# Patient Record
Sex: Female | Born: 1950
Health system: Southern US, Community
[De-identification: ages and names within clinical notes are randomized; demographics above are authoritative.]

## PROBLEM LIST (undated history)

## (undated) DIAGNOSIS — M810 Age-related osteoporosis without current pathological fracture: Secondary | ICD-10-CM

## (undated) DIAGNOSIS — J329 Chronic sinusitis, unspecified: Secondary | ICD-10-CM

## (undated) DIAGNOSIS — C44310 Basal cell carcinoma of skin of unspecified parts of face: Secondary | ICD-10-CM

## (undated) DIAGNOSIS — I341 Nonrheumatic mitral (valve) prolapse: Secondary | ICD-10-CM

## (undated) DIAGNOSIS — F419 Anxiety disorder, unspecified: Secondary | ICD-10-CM

## (undated) DIAGNOSIS — E871 Hypo-osmolality and hyponatremia: Secondary | ICD-10-CM

## (undated) DIAGNOSIS — N951 Menopausal and female climacteric states: Secondary | ICD-10-CM

## (undated) DIAGNOSIS — Z87891 Personal history of nicotine dependence: Secondary | ICD-10-CM

## (undated) HISTORY — DX: Nonrheumatic mitral (valve) prolapse: I34.1

## (undated) HISTORY — DX: Menopausal and female climacteric states: N95.1

## (undated) HISTORY — PX: ANAL FISSURE REPAIR: SHX2312

## (undated) HISTORY — DX: Chronic sinusitis, unspecified: J32.9

## (undated) HISTORY — DX: Basal cell carcinoma of skin of unspecified parts of face: C44.310

## (undated) HISTORY — DX: Hypo-osmolality and hyponatremia: E87.1

## (undated) HISTORY — DX: Anxiety disorder, unspecified: F41.9

## (undated) HISTORY — DX: Age-related osteoporosis without current pathological fracture: M81.0

## (undated) HISTORY — DX: Personal history of nicotine dependence: Z87.891

---

## 1993-11-28 HISTORY — PX: NASAL SINUS SURGERY: SHX719

## 1998-09-25 ENCOUNTER — Emergency Department (HOSPITAL_COMMUNITY): Admission: EM | Admit: 1998-09-25 | Discharge: 1998-09-25 | Payer: Self-pay | Admitting: Emergency Medicine

## 1998-09-25 ENCOUNTER — Encounter: Payer: Self-pay | Admitting: Emergency Medicine

## 1999-07-27 ENCOUNTER — Other Ambulatory Visit: Admission: RE | Admit: 1999-07-27 | Discharge: 1999-07-27 | Payer: Self-pay | Admitting: Gynecology

## 2001-01-04 ENCOUNTER — Other Ambulatory Visit: Admission: RE | Admit: 2001-01-04 | Discharge: 2001-01-04 | Payer: Self-pay | Admitting: *Deleted

## 2002-07-31 ENCOUNTER — Other Ambulatory Visit: Admission: RE | Admit: 2002-07-31 | Discharge: 2002-07-31 | Payer: Self-pay | Admitting: Family Medicine

## 2004-08-11 ENCOUNTER — Other Ambulatory Visit: Admission: RE | Admit: 2004-08-11 | Discharge: 2004-08-11 | Payer: Self-pay | Admitting: Family Medicine

## 2006-06-07 ENCOUNTER — Ambulatory Visit: Payer: Self-pay | Admitting: Family Medicine

## 2006-06-07 ENCOUNTER — Other Ambulatory Visit: Admission: RE | Admit: 2006-06-07 | Discharge: 2006-06-07 | Payer: Self-pay | Admitting: Family Medicine

## 2007-05-02 ENCOUNTER — Ambulatory Visit: Payer: Self-pay | Admitting: Family Medicine

## 2007-12-21 ENCOUNTER — Ambulatory Visit: Payer: Self-pay | Admitting: Family Medicine

## 2008-08-06 ENCOUNTER — Other Ambulatory Visit: Admission: RE | Admit: 2008-08-06 | Discharge: 2008-08-06 | Payer: Self-pay | Admitting: Family Medicine

## 2008-08-06 ENCOUNTER — Ambulatory Visit: Payer: Self-pay | Admitting: Family Medicine

## 2008-08-06 LAB — HM PAP SMEAR: HM Pap smear: NEGATIVE

## 2009-08-05 ENCOUNTER — Ambulatory Visit: Payer: Self-pay | Admitting: Family Medicine

## 2009-09-14 ENCOUNTER — Ambulatory Visit: Payer: Self-pay | Admitting: Family Medicine

## 2009-09-29 LAB — HM MAMMOGRAPHY: HM Mammogram: NEGATIVE

## 2009-09-29 LAB — HM DEXA SCAN

## 2009-12-03 ENCOUNTER — Ambulatory Visit: Payer: Self-pay | Admitting: Family Medicine

## 2009-12-21 ENCOUNTER — Encounter: Admission: RE | Admit: 2009-12-21 | Discharge: 2009-12-21 | Payer: Self-pay | Admitting: Family Medicine

## 2010-05-24 ENCOUNTER — Ambulatory Visit: Payer: Self-pay | Admitting: Family Medicine

## 2011-04-13 ENCOUNTER — Other Ambulatory Visit: Payer: Self-pay | Admitting: Family Medicine

## 2011-04-13 MED ORDER — ALPRAZOLAM 0.5 MG PO TABS: 0.5000 mg | ORAL_TABLET | Freq: Three times a day (TID) | ORAL | Status: AC | PRN

## 2011-04-13 NOTE — Telephone Encounter (Addendum)
Pt was former pt of kim. Best friend past away last night. Pt very upset on phone needs refill on Xanax and atenolol. Will schedule an appt for cpe as soon as she knows arrangements. The medication was called in

## 2011-04-15 ENCOUNTER — Telehealth: Payer: Self-pay

## 2011-04-15 ENCOUNTER — Telehealth: Payer: Self-pay | Admitting: Family Medicine

## 2011-04-15 MED ORDER — ATENOLOL 25 MG PO TABS: 25.0000 mg | ORAL_TABLET | Freq: Every day | ORAL | Status: DC

## 2011-04-15 NOTE — Telephone Encounter (Signed)
Her medication was called in. Have her set up an appointment for medication check.

## 2011-04-15 NOTE — Telephone Encounter (Signed)
Called pt and let pt know she needed a med check per jcl she said she would make one when all the funeral stuff was done

## 2011-04-15 NOTE — Telephone Encounter (Signed)
8 atenolol called in. She needs a followup appointment.

## 2011-09-22 ENCOUNTER — Encounter: Payer: Self-pay | Admitting: Family Medicine

## 2017-04-05 ENCOUNTER — Ambulatory Visit (INDEPENDENT_AMBULATORY_CARE_PROVIDER_SITE_OTHER): Payer: Medicare Other | Admitting: Family Medicine

## 2017-04-05 ENCOUNTER — Encounter: Payer: Self-pay | Admitting: Family Medicine

## 2017-04-05 VITALS — BP 140/90 | HR 88 | Ht 62.0 in | Wt 130.6 lb

## 2017-04-05 DIAGNOSIS — M810 Age-related osteoporosis without current pathological fracture: Secondary | ICD-10-CM | POA: Insufficient documentation

## 2017-04-05 DIAGNOSIS — Z87891 Personal history of nicotine dependence: Secondary | ICD-10-CM | POA: Diagnosis not present

## 2017-04-05 DIAGNOSIS — Z7689 Persons encountering health services in other specified circumstances: Secondary | ICD-10-CM

## 2017-04-05 DIAGNOSIS — L989 Disorder of the skin and subcutaneous tissue, unspecified: Secondary | ICD-10-CM

## 2017-04-05 DIAGNOSIS — M8580 Other specified disorders of bone density and structure, unspecified site: Secondary | ICD-10-CM | POA: Diagnosis not present

## 2017-04-05 NOTE — Progress Notes (Signed)
Subjective:    Patient ID: Kim Johnson, female    DOB: Oct 11, 1951, 66 y.o.   MRN: [id number]  HPI Chief Complaint  Patient presents with  . new pt    new pt get established. wants tetanus shot. no other concerns   She is here to establish care. Was a patient in this office previously but has not been seen in approximately 6 years.   States she has Medicare now and would like to come back for a welcome to Medicare visit.  States she would like a tetanus shot. 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   States she has a skin issue on her right side of her nose that has been present for 2-3 years and seems to be getting larger. Would like to see a dermatologist.  History of IBS. Denies issues with this.  History of asthma in her chart and she denies having any issues with this.  MVP in her chart and states she had more testing and this was actually ruled out.  She denies any other concerns or complaints today.   Denies fever, chills, unexplained weight loss, night sweats, headache, chest pain, palpitations, abdominal pain, N/V/D. No urinary symptoms.   Last CPE: 6-7 years ago.   Other providers: none  Social history: Lives alone. Widowed, works as a Architectural technologist.   Quit smoking 28 years ago. Smoked 1 ppd for 20 years.  States her husband was killed in the late 1990s.   Health maintenance:  Mammogram: 2011  Colonoscopy: age 38 DEXA: diagnosed with osteopenia at age 83 and she took medication for this.  Last Gynecological Exam: 2009 LMP: 2002  History of MVP? Had a stress test in 2004 and states it was normal.   Reviewed allergies, medications, past medical, surgical, family, and social history.    Review of Systems Pertinent positives and negatives in the history of present illness.     Objective:   Physical Exam  Constitutional: She appears well-developed and well-nourished. No distress.  HENT:  Nose:    Mouth/Throat: Uvula  is midline, oropharynx is clear and moist and mucous membranes are normal.  Red raised area, shiny and with an ulceration on her the right side of her nose.   Neck: Normal range of motion. Neck supple. No thyromegaly present.  Cardiovascular: Normal rate, regular rhythm and normal heart sounds.   Pulmonary/Chest: Effort normal and breath sounds normal.  Lymphadenopathy:    She has no cervical adenopathy.  Skin: Skin is warm and dry. No rash noted. No pallor.   BP 140/90   Pulse 88   Ht 5\' 2"  (1.575 m)   Wt 130 lb 9.6 oz (59.2 kg)   BMI 23.89 kg/m        Assessment & Plan:  Skin abnormality  Encounter to establish care  Former smoker  Osteopenia, unspecified location  Recommend she call and schedule a dermatology appointment for the spot on her nose. This is suspicious for BCC. She also has a history of this.  Prescription given for her to take to her pharmacy for Tdap and Shingrix. Advised her to call her insurance company prior to find out her financial responsibility prior to getting the shots.  Former smoker but quit at least 20 years ago.  Will need a bone density to follow up on osteopenia.  She will return in the next month for a IPPE, fasting labs and will need to be  updated on health maintenance.

## 2017-04-05 NOTE — Patient Instructions (Signed)
Dermatology offices  [facility]: Phone #: [phone] Address: [address], [place], [place] [address]  [facility]: Phone: [phone]  Address: [address], [place], [place] [address]

## 2017-05-08 ENCOUNTER — Other Ambulatory Visit (HOSPITAL_COMMUNITY)
Admission: RE | Admit: 2017-05-08 | Discharge: 2017-05-08 | Disposition: A | Discharge status: Home / Self Care | Payer: Medicare Other | Source: Ambulatory Visit | Attending: Family Medicine | Admitting: Family Medicine

## 2017-05-08 ENCOUNTER — Other Ambulatory Visit: Payer: Self-pay | Admitting: Family Medicine

## 2017-05-08 ENCOUNTER — Ambulatory Visit (INDEPENDENT_AMBULATORY_CARE_PROVIDER_SITE_OTHER): Payer: Medicare Other | Admitting: Family Medicine

## 2017-05-08 ENCOUNTER — Encounter: Payer: Self-pay | Admitting: Family Medicine

## 2017-05-08 VITALS — BP 120/80 | HR 60 | Ht 63.0 in | Wt 129.2 lb

## 2017-05-08 DIAGNOSIS — R5383 Other fatigue: Secondary | ICD-10-CM | POA: Diagnosis not present

## 2017-05-08 DIAGNOSIS — Z87898 Personal history of other specified conditions: Secondary | ICD-10-CM

## 2017-05-08 DIAGNOSIS — Z0000 Encounter for general adult medical examination without abnormal findings: Secondary | ICD-10-CM | POA: Diagnosis not present

## 2017-05-08 DIAGNOSIS — M25571 Pain in right ankle and joints of right foot: Secondary | ICD-10-CM | POA: Diagnosis not present

## 2017-05-08 DIAGNOSIS — Z1159 Encounter for screening for other viral diseases: Secondary | ICD-10-CM

## 2017-05-08 DIAGNOSIS — E2839 Other primary ovarian failure: Secondary | ICD-10-CM

## 2017-05-08 DIAGNOSIS — M8580 Other specified disorders of bone density and structure, unspecified site: Secondary | ICD-10-CM | POA: Diagnosis not present

## 2017-05-08 DIAGNOSIS — Z1211 Encounter for screening for malignant neoplasm of colon: Secondary | ICD-10-CM | POA: Diagnosis not present

## 2017-05-08 DIAGNOSIS — Z23 Encounter for immunization: Secondary | ICD-10-CM

## 2017-05-08 DIAGNOSIS — Z124 Encounter for screening for malignant neoplasm of cervix: Secondary | ICD-10-CM | POA: Diagnosis not present

## 2017-05-08 DIAGNOSIS — F418 Other specified anxiety disorders: Secondary | ICD-10-CM

## 2017-05-08 DIAGNOSIS — Z1231 Encounter for screening mammogram for malignant neoplasm of breast: Secondary | ICD-10-CM | POA: Diagnosis not present

## 2017-05-08 DIAGNOSIS — G8929 Other chronic pain: Secondary | ICD-10-CM

## 2017-05-08 DIAGNOSIS — Z87891 Personal history of nicotine dependence: Secondary | ICD-10-CM

## 2017-05-08 DIAGNOSIS — Z1239 Encounter for other screening for malignant neoplasm of breast: Secondary | ICD-10-CM

## 2017-05-08 DIAGNOSIS — R42 Dizziness and giddiness: Secondary | ICD-10-CM | POA: Diagnosis not present

## 2017-05-08 DIAGNOSIS — Z136 Encounter for screening for cardiovascular disorders: Secondary | ICD-10-CM | POA: Diagnosis not present

## 2017-05-08 LAB — POCT URINALYSIS DIP (PROADVANTAGE DEVICE)
Bilirubin, UA: NEGATIVE
Blood, UA: NEGATIVE
Glucose, UA: NEGATIVE mg/dL
Ketones, POC UA: NEGATIVE mg/dL
LEUKOCYTES UA: NEGATIVE
Nitrite, UA: NEGATIVE
PROTEIN UA: NEGATIVE mg/dL
Specific Gravity, Urine: 1.01
UUROB: NEGATIVE
pH, UA: 6.5 (ref 5.0–8.0)

## 2017-05-08 LAB — COMPREHENSIVE METABOLIC PANEL
ALBUMIN: 4.4 g/dL (ref 3.6–5.1)
ALT: 43 U/L — ABNORMAL HIGH (ref 6–29)
AST: 59 U/L — AB (ref 10–35)
Alkaline Phosphatase: 63 U/L (ref 33–130)
BILIRUBIN TOTAL: 0.9 mg/dL (ref 0.2–1.2)
BUN: 6 mg/dL — AB (ref 7–25)
CO2: 18 mmol/L — AB (ref 20–31)
CREATININE: 0.64 mg/dL (ref 0.50–0.99)
Calcium: 9.3 mg/dL (ref 8.6–10.4)
Chloride: 99 mmol/L (ref 98–110)
Glucose, Bld: 108 mg/dL — ABNORMAL HIGH (ref 65–99)
Potassium: 4.1 mmol/L (ref 3.5–5.3)
SODIUM: 129 mmol/L — AB (ref 135–146)
Total Protein: 7.2 g/dL (ref 6.1–8.1)

## 2017-05-08 LAB — CBC WITH DIFFERENTIAL/PLATELET
Basophils Absolute: 0 cells/uL (ref 0–200)
Basophils Relative: 0 %
EOS PCT: 2 %
Eosinophils Absolute: 110 cells/uL (ref 15–500)
HCT: 43.1 % (ref 35.0–45.0)
HEMOGLOBIN: 14.4 g/dL (ref 11.7–15.5)
LYMPHS ABS: 1320 {cells}/uL (ref 850–3900)
Lymphocytes Relative: 24 %
MCH: 31.6 pg (ref 27.0–33.0)
MCHC: 33.4 g/dL (ref 32.0–36.0)
MCV: 94.7 fL (ref 80.0–100.0)
MONOS PCT: 6 %
MPV: 10.8 fL (ref 7.5–12.5)
Monocytes Absolute: 330 cells/uL (ref 200–950)
NEUTROS ABS: 3740 {cells}/uL (ref 1500–7800)
Neutrophils Relative %: 68 %
PLATELETS: 197 10*3/uL (ref 140–400)
RBC: 4.55 MIL/uL (ref 3.80–5.10)
RDW: 13.1 % (ref 11.0–15.0)
WBC: 5.5 10*3/uL (ref 4.0–10.5)

## 2017-05-08 LAB — LIPID PANEL
CHOL/HDL RATIO: 2.9 ratio (ref <5.0)
Cholesterol: 174 mg/dL (ref <200)
HDL: 61 mg/dL (ref >50)
LDL CALC: 98 mg/dL (ref <100)
Triglycerides: 77 mg/dL (ref <150)
VLDL: 15 mg/dL (ref <30)

## 2017-05-08 LAB — TSH: TSH: 3.29 mIU/L

## 2017-05-08 MED ORDER — ALPRAZOLAM 0.25 MG PO TABS: 0.2500 mg | ORAL_TABLET | Freq: Two times a day (BID) | ORAL | 0 refills | Status: DC | PRN

## 2017-05-08 NOTE — Patient Instructions (Signed)
Call and schedule your mammogram and bone density.  The GI office will call you to schedule.   Call your insurance company regarding Shingrix and Tdap. Take this prescription to your pharmacy.   We will call you with lab results.    GENERAL RECOMMENDATIONS FOR GOOD HEALTH:  Supplements:  . Take a daily baby Aspirin 81mg  at bedtime for heart health unless you have a history of gastrointestinal bleed, allergy to aspirin, or are already taking higher dose Aspirin or other antiplatelet or blood thinner medication.   . Consume 1200 mg of Calcium daily through dietary calcium or supplement if you are female age 54 or older, or men 65 and older.   Men aged 36-70 should consume 1000 mg of Calcium daily. . Take 600 IU of Vitamin D daily.  Take 800 IU of Calcium daily if you are older than age 66.  . Take a general multivitamin daily.   Healthy diet: Eat a variety of foods, including fruits, vegetables, vegetable protein such as beans, lentils, tofu, and grains, such as rice.  Limit meat or animal protein, but if you eat meat, choose leans cuts such as chicken, fish, or [place].  Drink plenty of water daily.  Decrease saturated fat in the diet, avoid lots of red meat, processed foods, sweets, fast foods, and fried foods.  Limit salt and caffeine intake.  Exercise: Aerobic exercise helps maintain good heart health. Weight bearing exercise helps keep bones and muscles working strong.  We recommend at least 30-40 minutes of exercise most days of the week.   Fall prevention: Falls are the leading cause of injuries, accidents, and accidental deaths in people over the age of 33. Falling is a real threat to your ability to live on your own.  Causes include poor eyesight or poor hearing, illness, poor lighting, throw rugs, clutter in your home, and medication side effects causing dizziness or balance problems.  Such medications can include medications for depression, sleep problems, high blood pressure,  diabetes, and heart conditions.   PREVENTION  Be sure your home is as safe as possible. Here are some tips:  Wear shoes with non-skid soles (not house slippers).   Be sure your home and outside area are well lit.   Use night lights throughout your house, including hallways and stairways.   Remove clutter and clean up spills on floors and walkways.   Remove throw rugs or fasten them to the floor with carpet tape. Tack down carpet edges.   Do not place electrical cords across pathways.   Install grab bars in your bathtub, shower, and toilet area. Towel bars should not be used as a grab bar.   Install handrails on both sides of stairways.   Do not climb on stools or stepladders. Get someone else to help with jobs that require climbing.   Do not wax your floors at all, or use a non-skid wax.   Repair uneven or unsafe sidewalks, walkways or stairs.   Keep frequently used items within reach.   Be aware of pets so you do not trip.  Get regular check-ups from your doctor, and take good care of yourself:  Have your eyes checked every year for vision changes, cataracts, glaucoma, and other eye problems. Wear eyeglasses as directed.   Have your hearing checked every 2 years, or anytime you or others think that you cannot hear well. Use hearing aids as directed.   See your caregiver if you have foot pain or corns. Sore feet  can contribute to falls.   Let your caregiver know if a medicine is making you feel dizzy or making you lose your balance.   Use a cane, walker, or wheelchair as directed. Use walker or wheelchair brakes when getting in and out.   When you get up from bed, sit on the side of the bed for 1 to 2 minutes before you stand up. This will give your blood pressure time to adjust, and you will feel less dizzy.   If you need to go to the bathroom often, consider using a bedside commode.  Disease prevention:  If you smoke or chew tobacco, find out from your caregiver how  to quit. It can literally save your life, no matter how long you have been a tobacco user. If you do not use tobacco, never begin. Medicare does cover some smoking cessation counseling.  Maintain a healthy diet and normal weight. Increased weight leads to problems with blood pressure and diabetes. We check your height, weight, and BMI as part of your yearly visit.  The Body Mass Index or BMI is a way of measuring how much of your body is fat. Having a BMI above 27 increases the risk of heart disease, diabetes, hypertension, stroke and other problems related to obesity. Your caregiver can help determine your BMI and based on it develop an exercise and dietary program to help you achieve or maintain this important measurement at a healthful level.  High blood pressure causes heart and blood vessel problems.  Persistent high blood pressure should be treated with medicine if weight loss and exercise do not work.  We check your blood pressure as part of your yearly visit.  Avoid drinking alcohol in excess (more than two drinks per day).  Avoid use of street drugs. Do not share needles with anyone. Ask for professional help if you need assistance or instructions on stopping the use of alcohol, cigarettes, and/or drugs.  Brush your teeth twice a day with fluoride toothpaste, and floss once a day. Good oral hygiene prevents tooth decay and gum disease. The problems can be painful, unattractive, and can cause other health problems. Visit your dentist for a routine oral and dental checkup and preventive care every 6-12 months.   See your eye doctor yearly for routine screening for things like glaucoma.  Look at your skin regularly.  Use a mirror to look at your back. Notify your caregivers of changes in moles, especially if there are changes in shapes, colors, a size larger than a pencil eraser, an irregular border, or development of new moles.  Safety:  Use seatbelts 100% of the time, whether driving or as  a passenger.  Use safety devices such as hearing protection if you work in environments with loud noise or significant background noise.  Use safety glasses when doing any work that could send debris in to the eyes.  Use a helmet if you ride a bike or motorcycle.  Use appropriate safety gear for contact sports.  Talk to your caregiver about gun safety.  Use sunscreen with a SPF (or skin protection factor) of 15 or greater.  Lighter skinned people are at a greater risk of skin cancer. Don't forget to also wear sunglasses in order to protect your eyes from too much damaging sunlight. Damaging sunlight can accelerate cataract formation.   If you have multiple sexual partners, or if you are not in a monogamous relationship, practice safe sex. Use condoms. Condoms are used to help reduce the  spread of sexually transmitted infections (or STIs).  Consider an HIV test if you have never been tested.  Consider routine screening for STIs if you have multiple sexual partners.   Keep carbon monoxide and smoke detectors in your home functioning at all times. Change the batteries every 6 months or use a model that plugs into the wall or is hard wired in.   END OF LIFE PLANNING/ADVANCED DIRECTIVES Advance health-care planning is deciding the kind of care you want at the end of life. While alert competent adults are able to exercise their rights to make health care and financial decisions, problems arise when an individual becomes unconscious, incapacitated, or otherwise unable to communicate or make such decisions. Advance health care directives are the legal documents in which you give written instructions about your choices limited, aggressive or palliative care if, in the future, you cannot speak for yourself.  Advanced directives include the following: [place] allows you to appoint someone to act as your health care agent to make health care decisions for you should it be determined by your  health care provider that you are no longer able to make these decisions for yourself.  A Living Will is a legal document in which you can declare that under certain conditions you desire your life not be prolonged by extraordinary or artificial means during your last illness or when you are near death. We can provide you with sample advanced directives, you can get an attorney to prepare these for you, or you can visit [place] Secretary of State's website for additional information and resources at [email or web address]  Further, I recommend you have an attorney prepare a Will and Durable Power of Attorney if you haven't done so already.  Please get [place] a copy of your health care Advanced Directives.   PREVENTATIV E CARE RECOMMENDATIONS:  Vaccinations: We recommend the following vaccinations as part of your preventative care:  Pneumococcal vaccine is recommended to protect against certain types of pneumonia.  This is normally recommended for adults age 68 or older once, or up to every 5 years for those at high risk.  The vaccine is also recommended for adults younger than 66 years old with certain underlying conditions that make them high risk for pneumonia.  Influenza vaccine is recommended to protect against seasonal influenza or "the flu." Influenza is a serious disease that can lead to hospitalization and sometimes even death. Traditional flu vaccines (called trivalent vaccines) are made to protect against three flu viruses; an influenza A (H1N1) virus, an influenza A (H3N2) virus, and an influenza B virus. In addition, there are flu vaccines made to protect against four flu viruses (called "quadrivalent" vaccines). These vaccines protect against the same viruses as the trivalent vaccine and an additional B virus.  We recommend the high dose influenza vaccine to those 65 years and older.  Hepatitis B vaccine to protect against a form of infection of the liver by a virus acquired from  blood or body fluids, particularly for high risk groups.  Td or Tdap vaccine to protect against Tetanus, diphtheria and pertussis which can be very serious.  These diseases are caused by bacteria.  Diphtheria and pertussis are spread from person to person through coughing or sneezing.  Tetanus enters the body through cuts, scratches, or wounds.  Tetanus (Lockjaw) causes painful muscle tightening and stiffness, usually all over the body.  Diphtheria can cause a thick coating to form in the back of the throat.  It can lead to breathing problems, paralysis, heart failure, and death.  Pertussis (Whooping Cough) causes severe coughing spells, which can cause difficulty breathing, vomiting and disturbed sleep.  Td or Tdap is usually given every 10 years.  Shingles vaccine to protect against Varicella Zoster if you are older than age 79, or younger than 66 years old with certain underlying illness.    Cancer Screening: Most routine colon cancer screening begins at the age of 90.  Subsequent colonoscopies are performed either every 5-10 years for normal screening, or every 2-5 years for higher risks patients, up until age 76 years of age. Annual screening is done with easy to use take-home tests to check for hidden blood in the stool called hemoccult tests.  Sigmoidoscopy or colonoscopy can detect the earliest forms of colon cancer and is life saving. These tests use a small camera at the end of a tube to directly examine the colon.   Pelvic Exam and Pap Smear: Pelvic exams and pap smears are performed routinely to evaluate for abnormalities as well as cancers including cervical and vaginal cancers.  This is generally performed every 2-3 years for most women, or more frequently for higher risk patients.  Mammograms: Mammograms are used to screen for breast cancer.  Medicare covers baseline screening once from ages 1-94 years old, but will cover mammograms yearly for those 40 years and older.  In accordance  with other guidelines, you may not need a mammogram every year though.  The decision on how frequently you need a mammogram should be discussed with you medical provider.    Osteoporosis Screening: Screening for osteoporosis usually begins at age 46 for women, and can be done as frequent as every 2 years.  However, women or men with higher risk of osteoporosis may be screened earlier than age 57.  Osteoporosis or low bone mass is diminished bone strength from alterations in bone architecture leading to bone fragility and increased fracture risk.     Cardiovascular Screening: Fat and cholesterol leaves deposits in your arteries that can block them. This causes heart disease and vessel disease elsewhere in your body.  If your cholesterol is found to be high, or if you have heart disease or certain other medical conditions, then you may need to have your cholesterol monitored frequently and be treated with medication. Cardiovascular screening in the form of lab tests for cholesterol, HDL and triglycerides can be done every 5 years.  A screening electrocardiogram can be done as part of the Welcome to Medicare physical.  Diabetes Screening: Diabetes screening can be done at least every 3 years for those with risk factors,  or every 6-26months for prediabetic patients.  Screening includes fasting blood sugar test or glucose tolerance test.  Risk factors include hypertension, dyslipidemia, obesity, previously abnormal glucose tests, family history of diabetes, age 49 years or older, and history of gestations diabetes.   AAA (abdominal aortic aneurysm) Screening: Medicare allows for a one time ultrasound to screen for abdominal aortic aneurysm if done as a referral as part of the Welcome to Medicare exam.  Men eligible for this screening include those men between age 105-70 years of age who have smoked at least 100 cigarettes in his lifetime and/or has a family history of AAA.  HIV Screening:  Medicare allows  for yearly screening for patients at high risk for contracting HIV disease.

## 2017-05-08 NOTE — Progress Notes (Signed)
Kim Johnson is a 66 y.o. female who presents for annual wellness visit and follow-up on chronic medical conditions.  She has the following concerns:   States she was drinking a bottle of wine nightly a year ago. She did this for approximately 5 years after losing her boyfriend. States now she is doing better and is only having 1 glass a few days each week. She has not been to counseling for the loss of her boyfriend. Is not currently interested in seeing a counselor. States she is doing much better emotionally. Denies feeling depressed.  She has been doing nutri-system and has lost weight and is happy about this. Lately, her right ankle has been hurting her and keeping her from being able to walk or do a lot of her exercises. Reports history of multiple ankle injuries and has seen ortho in the past for this. Would like to go back to ortho but needs a recommendation since it has been several years since she saw anyone.    States she has a history of "stage fright". 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 Has taken atenolol in the past and Xanax 0.5mg . States it has been at least 5 years since she has taken any medication for performance anxiety.   States she has intermittent episodes of dizziness with position changes and worse with laying down sometimes. States she has been told this is vertigo but she does not take medication for it. States she just "deals with it". No dizziness in several weeks.      Immunization History  Administered Date(s) Administered  . DTaP 07/31/2002  . PPD Test 01/04/2001  . Pneumococcal Conjugate-13 01/04/2001, 05/08/2017  . Td 07/31/2002   Last Pap smear: 07/2008 and normal.  Last mammogram: 09/2009 Last colonoscopy: 30 years ago per patient for an anal fistula.  Last DEXA: 09/2009 diagnosed with Osteopenia at age 53. Is not taking vitamin D or any supplements.  Dentist: last week.  Ophtho: years ago. Visual acuity  ok today.  Exercise: not in the past year due to ankle pain. She works in her garden.   Other doctors caring for patient include:  Dr. Norman Herrlich is dentist in [place].    Depression screen:  See questionnaire below.  Depression screen PHQ 2/9 05/08/2017  Decreased Interest 0  Down, Depressed, Hopeless 0  PHQ - 2 Score 0    Fall Risk Screen: see questionnaire below. Fall Risk  05/08/2017  Falls in the past year? No    ADL screen:  See questionnaire below Functional Status Survey: Is the patient deaf or have difficulty hearing?: No Does the patient have difficulty seeing, even when wearing glasses/contacts?: No Does the patient have difficulty concentrating, remembering, or making decisions?: No Does the patient have difficulty walking or climbing stairs?: No Does the patient have difficulty dressing or bathing?: No Does the patient have difficulty doing errands alone such as visiting a doctor's office or shopping?: No   End of Life Discussion:  Patient has a living will and medical power of attorney.   Review of Systems Constitutional: -fever, -chills, -sweats, -unexpected weight change, -anorexia, +mild fatigue Allergy: -sneezing, -itching, -congestion Dermatology: denies changing moles, rash, lumps, new worrisome lesions ENT: -runny nose, -ear pain, -sore throat, -hoarseness, -sinus pain, -teeth pain, -tinnitus, -hearing loss, -epistaxis Cardiology:  -chest pain, -palpitations, -edema, -orthopnea, -paroxysmal nocturnal dyspnea Respiratory: -cough, -shortness of breath, -dyspnea on exertion, -wheezing, -hemoptysis Gastroenterology: -abdominal pain, -nausea, -vomiting, -diarrhea, -  constipation, -blood in stool, -changes in bowel movement, -dysphagia Hematology: -bleeding or bruising problems Musculoskeletal: -arthralgias, -myalgias, -joint swelling, -back pain, -neck pain, -cramping, -gait changes Ophthalmology: -vision changes, -eye redness, -itching, -discharge Urology:  -dysuria, -difficulty urinating, -hematuria, -urinary frequency, -urgency, incontinence Neurology: -headache, -weakness, -tingling, -numbness, -speech abnormality, -memory loss, -falls, -dizziness Psychology:  -depressed mood, -agitation, -sleep problems    PHYSICAL EXAM:  BP 120/80   Pulse 60   Ht 5\' 3"  (1.6 m)   Wt 129 lb 3.2 oz (58.6 kg)   BMI 22.89 kg/m   General Appearance: Alert, cooperative, no distress, appears stated age Head: Normocephalic, without obvious abnormality, atraumatic Eyes: PERRL, conjunctiva/corneas clear, EOM's intact, fundi benign Ears: Normal TM's and external ear canals Nose: Nares normal, mucosa normal, no drainage or sinus   tenderness Throat: Lips, mucosa, and tongue normal; teeth and gums normal Neck: Supple, no lymphadenopathy; thyroid: no enlargement/tenderness/nodules; no carotid bruit or JVD Back: Spine nontender, no curvature, ROM normal, no CVA tenderness Lungs: Clear to auscultation bilaterally without wheezes, rales or ronchi; respirations unlabored Chest Wall: No tenderness or deformity Heart: Regular rate and rhythm, S1 and S2 normal, no murmur, rub or gallop Breast Exam: No tenderness, masses, or nipple discharge or inversion. No axillary lymphadenopathy Abdomen: Soft, non-tender, nondistended, normoactive bowel sounds, no masses, no hepatosplenomegaly Genitalia: Normal external genitalia without lesions.  BUS and vagina normal; cervix without lesions, or cervical motion tenderness. No abnormal vaginal discharge.  Uterus and adnexa not enlarged, nontender, no masses.  Pap performed Rectal: Normal tone, no masses or tenderness; guaiac negative stool Extremities: No clubbing, cyanosis or edema Pulses: 2+ and symmetric all extremities Skin: Skin color, texture, turgor normal, no rashes or lesions Lymph nodes: Cervical, supraclavicular, and axillary nodes normal Neurologic: CNII-XII intact, normal strength, sensation and gait; reflexes 2+ and  symmetric throughout Psych: Normal mood, affect, hygiene and grooming.  ASSESSMENT/PLAN: Initial Medicare annual wellness visit - Plan: POCT Urinalysis DIP (Proadvantage Device), EKG 12-Lead  Former smoker  Osteopenia, unspecified location - Plan: DG Bone Density  History of vertigo - Plan: Comprehensive metabolic panel  Chronic pain of right ankle  Need for hepatitis C screening test - Plan: Hepatitis C antibody  Screen for colon cancer - Plan: Ambulatory referral to Gastroenterology  Screening for breast cancer - Plan: MM DIGITAL SCREENING BILATERAL  Estrogen deficiency - Plan: DG Bone Density  Need for vaccination against Streptococcus pneumoniae - Plan: Pneumococcal conjugate vaccine 13-valent  Screening for cervical cancer - Plan: Cytology - PAP  Situational anxiety - Plan: ALPRAZolam (XANAX) 0.25 MG tablet  Episode of dizziness - Plan: CBC with Differential/Platelet, Comprehensive metabolic panel, TSH  Screening for heart disease - Plan: Lipid panel  Fatigue, unspecified type - Plan: CBC with Differential/Platelet, Comprehensive metabolic panel, TSH  Right ankle exam is unremarkable but due to long history of pain and the fact that pain is now interfering with her daily activities we will refer her to orthopedist per request.  Offered to help her with finding a counselor but she declines. She is not abusing alcohol currently and agrees that if she starts self medicating  again with alcohol that she will let me know.  Will give her a few Alprazolam to help her with stage fright. Recommend counseling for this as well but she declines. Advised her to avoid driving or drinking alcohol while taking this medication.  History of dizziness that appear to be vertigo. Discussed taking meclizine if needed in the future and following up with me. Will  also check labs.  Mild fatigue-check labs. No sing of depression.  UA negative.  Discussed that she is deficient in several areas of  preventive health and will get her updated.  Mammogram and bone density ordered. History of osteopenia and counseled on calcium and vitamin D intake as well as weight bearing exercise.  GI referral made.  ECG performed and some questionable LVH. Reviewed by myself and Dr. Redmond School.  One time Hep C testing done.  Prevenar 13 given. Prescription for Tdap and Shingrix given and she will check with her insurance company regarding cost.  Pap smear performed, chaperone present.  Follow up pending fasting labs.    Discussed monthly self breast exams and yearly mammograms; at least 30 minutes of aerobic activity at least 5 days/week and weight-bearing exercise 2x/week; proper sunscreen use reviewed; healthy diet, including goals of calcium and vitamin D intake and alcohol recommendations (less than or equal to 1 drink/day) reviewed; regular seatbelt use; changing batteries in smoke detectors.  Immunization recommendations discussed.  Colonoscopy recommendations reviewed   Medicare Attestation I have personally reviewed: The patient's medical and social history Their use of alcohol, tobacco or illicit drugs Their current medications and supplements The patient's functional ability including ADLs,fall risks, home safety risks, cognitive, and hearing and visual impairment Diet and physical activities Evidence for depression or mood disorders  The patient's weight, height, and BMI have been recorded in the chart.  I have made referrals, counseling, and provided education to the patient based on review of the above and I have provided the patient with a written personalized care plan for preventive services.     Harland Dingwall, NP-C   05/08/2017

## 2017-05-09 ENCOUNTER — Other Ambulatory Visit: Payer: Self-pay | Admitting: Family Medicine

## 2017-05-09 ENCOUNTER — Encounter: Payer: Self-pay | Admitting: Family Medicine

## 2017-05-09 DIAGNOSIS — E871 Hypo-osmolality and hyponatremia: Secondary | ICD-10-CM | POA: Insufficient documentation

## 2017-05-09 DIAGNOSIS — R7301 Impaired fasting glucose: Secondary | ICD-10-CM | POA: Insufficient documentation

## 2017-05-09 DIAGNOSIS — F1011 Alcohol abuse, in remission: Secondary | ICD-10-CM | POA: Insufficient documentation

## 2017-05-09 DIAGNOSIS — R748 Abnormal levels of other serum enzymes: Secondary | ICD-10-CM

## 2017-05-09 DIAGNOSIS — M25571 Pain in right ankle and joints of right foot: Secondary | ICD-10-CM

## 2017-05-09 DIAGNOSIS — G8929 Other chronic pain: Secondary | ICD-10-CM | POA: Insufficient documentation

## 2017-05-09 LAB — HEPATITIS PANEL, ACUTE
HCV AB: NEGATIVE
HEP A IGM: NONREACTIVE
HEP B S AG: NEGATIVE
Hep B C IgM: NONREACTIVE

## 2017-05-09 LAB — HEPATITIS C ANTIBODY: HCV AB: NEGATIVE

## 2017-05-10 ENCOUNTER — Other Ambulatory Visit: Payer: Self-pay | Admitting: Family Medicine

## 2017-05-10 ENCOUNTER — Encounter: Payer: Self-pay | Admitting: Family Medicine

## 2017-05-10 ENCOUNTER — Encounter: Payer: Self-pay | Admitting: Internal Medicine

## 2017-05-10 ENCOUNTER — Telehealth: Payer: Self-pay | Admitting: Family Medicine

## 2017-05-10 DIAGNOSIS — R748 Abnormal levels of other serum enzymes: Secondary | ICD-10-CM

## 2017-05-10 DIAGNOSIS — E871 Hypo-osmolality and hyponatremia: Secondary | ICD-10-CM | POA: Insufficient documentation

## 2017-05-10 LAB — HEMOGLOBIN A1C
Hgb A1c MFr Bld: 5.2 % (ref <5.7)
Mean Plasma Glucose: 103 mg/dL

## 2017-05-10 LAB — CYTOLOGY - PAP
Adequacy: ABSENT
Diagnosis: NEGATIVE

## 2017-05-10 NOTE — Telephone Encounter (Signed)
Attempted to call patient regarding lab results. Left voice mail to call back.

## 2017-05-12 ENCOUNTER — Telehealth: Payer: Self-pay | Admitting: Family Medicine

## 2017-05-12 NOTE — Telephone Encounter (Signed)
She does not need to over do it with her sodium intake, just have a normal amount of sodium since she has been restricting it. The only way to tell if her sodium level is improving is to have her come in for a lab visit. Have her do this when she can. Thanks

## 2017-05-12 NOTE — Telephone Encounter (Signed)
Pt called and cancelled her lab appt for Monday.  She states she has a family member that is sick and is leaving to go tend to her.She did not reschedule because she is not sure when she will be back but will so so when she returns.  She wanted me to let Dr. Tomi Bamberger know that she is drinking Gatorade with 7% sodium. Pt can be reached at [phone].

## 2017-05-15 ENCOUNTER — Other Ambulatory Visit: Payer: Medicare Other

## 2017-05-15 NOTE — Telephone Encounter (Signed)
Pt was notified. She has too much going on right now, she will have to call back and schedule an appt

## 2017-05-15 NOTE — Telephone Encounter (Signed)
Left message for pt to call me back 

## 2017-05-17 ENCOUNTER — Other Ambulatory Visit: Payer: Self-pay

## 2017-05-18 ENCOUNTER — Encounter: Payer: Self-pay | Admitting: Internal Medicine

## 2017-07-11 ENCOUNTER — Other Ambulatory Visit: Payer: Self-pay

## 2017-07-20 ENCOUNTER — Encounter: Payer: Self-pay | Admitting: Family Medicine

## 2017-08-17 ENCOUNTER — Ambulatory Visit
Admission: RE | Admit: 2017-08-17 | Discharge: 2017-08-17 | Disposition: A | Discharge status: Home / Self Care | Payer: Medicare Other | Source: Ambulatory Visit | Attending: Family Medicine | Admitting: Family Medicine

## 2017-08-17 ENCOUNTER — Ambulatory Visit: Payer: Medicare Other

## 2017-08-17 ENCOUNTER — Encounter (INDEPENDENT_AMBULATORY_CARE_PROVIDER_SITE_OTHER): Payer: Self-pay

## 2017-08-17 DIAGNOSIS — Z1231 Encounter for screening mammogram for malignant neoplasm of breast: Secondary | ICD-10-CM | POA: Diagnosis not present

## 2017-08-17 DIAGNOSIS — E2839 Other primary ovarian failure: Secondary | ICD-10-CM

## 2017-08-17 DIAGNOSIS — Z1239 Encounter for other screening for malignant neoplasm of breast: Secondary | ICD-10-CM

## 2017-08-17 DIAGNOSIS — M8580 Other specified disorders of bone density and structure, unspecified site: Secondary | ICD-10-CM

## 2017-08-17 DIAGNOSIS — M810 Age-related osteoporosis without current pathological fracture: Secondary | ICD-10-CM | POA: Diagnosis not present

## 2017-08-17 DIAGNOSIS — Z780 Asymptomatic menopausal state: Secondary | ICD-10-CM | POA: Diagnosis not present

## 2017-08-24 ENCOUNTER — Institutional Professional Consult (permissible substitution): Payer: Medicare Other | Admitting: Family Medicine

## 2017-08-24 ENCOUNTER — Ambulatory Visit (INDEPENDENT_AMBULATORY_CARE_PROVIDER_SITE_OTHER): Payer: Medicare Other | Admitting: Family Medicine

## 2017-08-24 ENCOUNTER — Encounter: Payer: Self-pay | Admitting: Family Medicine

## 2017-08-24 VITALS — BP 124/68 | HR 82 | Ht 62.0 in | Wt 123.8 lb

## 2017-08-24 DIAGNOSIS — E871 Hypo-osmolality and hyponatremia: Secondary | ICD-10-CM | POA: Diagnosis not present

## 2017-08-24 DIAGNOSIS — F418 Other specified anxiety disorders: Secondary | ICD-10-CM | POA: Diagnosis not present

## 2017-08-24 DIAGNOSIS — R748 Abnormal levels of other serum enzymes: Secondary | ICD-10-CM

## 2017-08-24 DIAGNOSIS — L814 Other melanin hyperpigmentation: Secondary | ICD-10-CM | POA: Diagnosis not present

## 2017-08-24 DIAGNOSIS — D225 Melanocytic nevi of trunk: Secondary | ICD-10-CM | POA: Diagnosis not present

## 2017-08-24 DIAGNOSIS — C44311 Basal cell carcinoma of skin of nose: Secondary | ICD-10-CM | POA: Diagnosis not present

## 2017-08-24 DIAGNOSIS — E559 Vitamin D deficiency, unspecified: Secondary | ICD-10-CM | POA: Diagnosis not present

## 2017-08-24 DIAGNOSIS — M810 Age-related osteoporosis without current pathological fracture: Secondary | ICD-10-CM

## 2017-08-24 DIAGNOSIS — B078 Other viral warts: Secondary | ICD-10-CM | POA: Diagnosis not present

## 2017-08-24 DIAGNOSIS — D485 Neoplasm of uncertain behavior of skin: Secondary | ICD-10-CM | POA: Diagnosis not present

## 2017-08-24 DIAGNOSIS — Z79899 Other long term (current) drug therapy: Secondary | ICD-10-CM

## 2017-08-24 DIAGNOSIS — L570 Actinic keratosis: Secondary | ICD-10-CM | POA: Diagnosis not present

## 2017-08-24 DIAGNOSIS — L821 Other seborrheic keratosis: Secondary | ICD-10-CM | POA: Diagnosis not present

## 2017-08-24 DIAGNOSIS — D2261 Melanocytic nevi of right upper limb, including shoulder: Secondary | ICD-10-CM | POA: Diagnosis not present

## 2017-08-24 DIAGNOSIS — D2262 Melanocytic nevi of left upper limb, including shoulder: Secondary | ICD-10-CM | POA: Diagnosis not present

## 2017-08-24 MED ORDER — ALPRAZOLAM 0.25 MG PO TABS: 0.2500 mg | ORAL_TABLET | Freq: Two times a day (BID) | ORAL | 0 refills | Status: AC | PRN

## 2017-08-24 NOTE — Progress Notes (Signed)
   Subjective:    Patient ID: Kim Johnson, female    DOB: [birth year], 66 y.o.   MRN: [id number]  HPI Chief Complaint  Patient presents with  . discuss dexa scan    discuss dexa scan results    She is here to discuss abnormal bone density results. She now has osteoporosis. States she was diagnosed with osteopenia at age 22. She reports taking Fosamax for several years in her 4s. Currently she is not on any treatment. She is not taking calcium or vitamin D. Diet is not rich in calcium. History of vitamin D deficiency.   She also has a history of low sodium. At the time of our last visit she was drinking a lot of water and on a diet, nutrisystem.  States for the past 3 days and she has eaten regular food and has not overdone it with water.   History of elevated liver enzymes. States she stopped drinking 2 months ago. She is hoping her liver function is now back to normal. She did not go for the [place] but will call to reschedule this.   She is seeing a dermatologist later today for a skin issue on her nose and states she is very anxious about this. States she took half a Xanax today because she was worried about her appointments. Requests a refill of this medication.   Denies fever, chills, dizziness, chest pain, palpitations, shortness of breath, abdominal pain, N/V/D, urinary symptoms, LE edema.   Reviewed allergies, medications, past medical, surgical, family, and social history.   Review of Systems Pertinent positives and negatives in the history of present illness.     Objective:   Physical Exam BP 124/68   Pulse 82   Ht 5\' 2"  (1.575 m)   Wt 123 lb 12.8 oz (56.2 kg)   SpO2 97%   BMI 22.64 kg/m   Alert and oriented and in no acute distress. Not otherwise examined.      Assessment & Plan:  Age-related osteoporosis without current pathological fracture - Plan: VITAMIN D 25 Hydroxy (Vit-D Deficiency, Fractures)  Chronic hyponatremia - Plan: COMPLETE METABOLIC PANEL WITH  GFR  Elevated liver enzymes - Plan: COMPLETE METABOLIC PANEL WITH GFR  Situational anxiety - Plan: ALPRAZolam (XANAX) 0.25 MG tablet  Medication management - Plan: VITAMIN D 25 Hydroxy (Vit-D Deficiency, Fractures)  Vitamin D deficiency - Plan: VITAMIN D 25 Hydroxy (Vit-D Deficiency, Fractures)  Counseled on osteoporosis and management including Prolia. It sounds as though she has taken Fosamax for at least 5 years in the past. Recommend weight bearing exercises, 1,200 mg of daily calcium and vitamin D. Will check her vitamin D level and dose her as appropriate.  Congratulated her on stopping alcohol recently. Will recheck LFTs and have her reschedule the abdominal US.  Refilled Xanax, she is not abusing this.  Follow up pending labs.

## 2017-08-24 NOTE — Patient Instructions (Addendum)
We will call you with your lab results.  Call to reschedule the liver ultrasound.   Juliann Pulse from our office will call you in the next 2 weeks with information on starting Prolia for osteoporosis.    Osteoporosis Osteoporosis is the thinning and loss of density in the bones. Osteoporosis makes the bones more brittle, fragile, and likely to break (fracture). Over time, osteoporosis can cause the bones to become so weak that they fracture after a simple fall. The bones most likely to fracture are the bones in the hip, wrist, and spine. What are the causes? The exact cause is not known. What increases the risk? Anyone can develop osteoporosis. You may be at greater risk if you have a family history of the condition or have poor nutrition. You may also have a higher risk if you are:  Female.  7 years old or older.  A smoker.  Not physically active.  White or Asian.  Slender.  What are the signs or symptoms? A fracture might be the first sign of the disease, especially if it results from a fall or injury that would not usually cause a bone to break. Other signs and symptoms include:  Low back and neck pain.  Stooped posture.  Height loss.  How is this diagnosed? To make a diagnosis, your health care provider may:  Take a medical history.  Perform a physical exam.  Order tests, such as: ? A bone mineral density test. ? A dual-energy X-ray absorptiometry test.  How is this treated? The goal of osteoporosis treatment is to strengthen your bones to reduce your risk of a fracture. Treatment may involve:  Making lifestyle changes, such as: ? Eating a diet rich in calcium. ? Doing weight-bearing and muscle-strengthening exercises. ? Stopping tobacco use. ? Limiting alcohol intake.  Taking medicine to slow the process of bone loss or to increase bone density.  Monitoring your levels of calcium and vitamin D.  Follow these instructions at home:  Include calcium and vitamin  D in your diet. Calcium is important for bone health, and vitamin D helps the body absorb calcium.  Perform weight-bearing and muscle-strengthening exercises as directed by your health care provider.  Do not use any tobacco products, including cigarettes, chewing tobacco, and electronic cigarettes. If you need help quitting, ask your health care provider.  Limit your alcohol intake.  Take medicines only as directed by your health care provider.  Keep all follow-up visits as directed by your health care provider. This is important.  Take precautions at home to lower your risk of falling, such as: ? Keeping rooms well lit and clutter free. ? Installing safety rails on stairs. ? Using rubber mats in the bathroom and other areas that are often wet or slippery. Get help right away if: You fall or injure yourself. This information is not intended to replace advice given to you by your health care provider. Make sure you discuss any questions you have with your health care provider. Document Released: 08/24/2005 Document Revised: 04/18/2016 Document Reviewed: 04/24/2014 Elsevier Interactive Patient Education  2017 [place].    Denosumab injection What is this medicine? DENOSUMAB (den oh sue mab) slows bone breakdown. Prolia is used to treat osteoporosis in women after menopause and in men. Delton See is used to treat a high calcium level due to cancer and to prevent bone fractures and other bone problems caused by multiple myeloma or cancer bone metastases. Delton See is also used to treat giant cell tumor of the  bone. This medicine may be used for other purposes; ask your health care provider or pharmacist if you have questions. COMMON BRAND NAME(S): Prolia, XGEVA What should I tell my health care provider before I take this medicine? They need to know if you have any of these conditions: -dental disease -having surgery or tooth extraction -infection -kidney disease -low levels of calcium or  Vitamin D in the blood -malnutrition -on hemodialysis -skin conditions or sensitivity -thyroid or parathyroid disease -an unusual reaction to denosumab, other medicines, foods, dyes, or preservatives -pregnant or trying to get pregnant -breast-feeding How should I use this medicine? This medicine is for injection under the skin. It is given by a health care professional in a hospital or clinic setting. If you are getting Prolia, a special MedGuide will be given to you by the pharmacist with each prescription and refill. Be sure to read this information carefully each time. For Prolia, talk to your pediatrician regarding the use of this medicine in children. Special care may be needed. For Delton See, talk to your pediatrician regarding the use of this medicine in children. While this drug may be prescribed for children as young as 13 years for selected conditions, precautions do apply. Overdosage: If you think you have taken too much of this medicine contact a poison control center or emergency room at once. NOTE: This medicine is only for you. Do not share this medicine with others. What if I miss a dose? It is important not to miss your dose. Call your doctor or health care professional if you are unable to keep an appointment. What may interact with this medicine? Do not take this medicine with any of the following medications: -other medicines containing denosumab This medicine may also interact with the following medications: -medicines that lower your chance of fighting infection -steroid medicines like prednisone or cortisone This list may not describe all possible interactions. Give your health care provider a list of all the medicines, herbs, non-prescription drugs, or dietary supplements you use. Also tell them if you smoke, drink alcohol, or use illegal drugs. Some items may interact with your medicine. What should I watch for while using this medicine? Visit your doctor or health care  professional for regular checks on your progress. Your doctor or health care professional may order blood tests and other tests to see how you are doing. Call your doctor or health care professional for advice if you get a fever, chills or sore throat, or other symptoms of a cold or flu. Do not treat yourself. This drug may decrease your body's ability to fight infection. Try to avoid being around people who are sick. You should make sure you get enough calcium and vitamin D while you are taking this medicine, unless your doctor tells you not to. Discuss the foods you eat and the vitamins you take with your health care professional. See your dentist regularly. Brush and floss your teeth as directed. Before you have any dental work done, tell your dentist you are receiving this medicine. Do not become pregnant while taking this medicine or for 5 months after stopping it. Talk with your doctor or health care professional about your birth control options while taking this medicine. Women should inform their doctor if they wish to become pregnant or think they might be pregnant. There is a potential for serious side effects to an unborn child. Talk to your health care professional or pharmacist for more information. What side effects may I notice from receiving  this medicine? Side effects that you should report to your doctor or health care professional as soon as possible: -allergic reactions like skin rash, itching or hives, swelling of the face, lips, or tongue -bone pain -breathing problems -dizziness -jaw pain, especially after dental work -redness, blistering, peeling of the skin -signs and symptoms of infection like fever or chills; cough; sore throat; pain or trouble passing urine -signs of low calcium like fast heartbeat, muscle cramps or muscle pain; pain, tingling, numbness in the hands or feet; seizures -unusual bleeding or bruising -unusually weak or tired Side effects that usually do not  require medical attention (report to your doctor or health care professional if they continue or are bothersome): -constipation -diarrhea -headache -joint pain -loss of appetite -muscle pain -runny nose -tiredness -upset stomach This list may not describe all possible side effects. Call your doctor for medical advice about side effects. You may report side effects to FDA at 1-800-FDA-1088. Where should I keep my medicine? This medicine is only given in a clinic, doctor's office, or other health care setting and will not be stored at home. NOTE: This sheet is a summary. It may not cover all possible information. If you have questions about this medicine, talk to your doctor, pharmacist, or health care provider.  2018 Elsevier/Gold Standard (2016-12-06 19:17:21)

## 2017-08-25 ENCOUNTER — Other Ambulatory Visit: Payer: Self-pay | Admitting: Family Medicine

## 2017-08-25 DIAGNOSIS — Z79899 Other long term (current) drug therapy: Secondary | ICD-10-CM

## 2017-08-25 DIAGNOSIS — E559 Vitamin D deficiency, unspecified: Secondary | ICD-10-CM

## 2017-08-25 LAB — COMPLETE METABOLIC PANEL WITH GFR
AG RATIO: 1.7 (calc) (ref 1.0–2.5)
ALT: 33 U/L — AB (ref 6–29)
AST: 34 U/L (ref 10–35)
Albumin: 4.8 g/dL (ref 3.6–5.1)
Alkaline phosphatase (APISO): 63 U/L (ref 33–130)
BUN: 10 mg/dL (ref 7–25)
CALCIUM: 9.4 mg/dL (ref 8.6–10.4)
CHLORIDE: 102 mmol/L (ref 98–110)
CO2: 23 mmol/L (ref 20–32)
Creat: 0.58 mg/dL (ref 0.50–0.99)
GFR, EST AFRICAN AMERICAN: 111 mL/min/{1.73_m2} (ref >=60)
GFR, EST NON AFRICAN AMERICAN: 96 mL/min/{1.73_m2} (ref >=60)
GLOBULIN: 2.8 g/dL (ref 1.9–3.7)
Glucose, Bld: 83 mg/dL (ref 65–99)
POTASSIUM: 4.1 mmol/L (ref 3.5–5.3)
Sodium: 137 mmol/L (ref 135–146)
TOTAL PROTEIN: 7.6 g/dL (ref 6.1–8.1)
Total Bilirubin: 0.7 mg/dL (ref 0.2–1.2)

## 2017-08-25 LAB — VITAMIN D 25 HYDROXY (VIT D DEFICIENCY, FRACTURES): Vit D, 25-Hydroxy: 18 ng/mL — ABNORMAL LOW (ref 30–100)

## 2017-08-25 MED ORDER — VITAMIN D (ERGOCALCIFEROL) 1.25 MG (50000 UNIT) PO CAPS: 50000.0000 [IU] | ORAL_CAPSULE | ORAL | 0 refills | Status: AC

## 2017-09-13 DIAGNOSIS — Z85828 Personal history of other malignant neoplasm of skin: Secondary | ICD-10-CM | POA: Diagnosis not present

## 2017-09-13 DIAGNOSIS — C44311 Basal cell carcinoma of skin of nose: Secondary | ICD-10-CM | POA: Diagnosis not present

## 2017-09-21 NOTE — Addendum Note (Signed)
Addended by: Girtha Rm on: 09/21/2017 08:15 AM   Modules accepted: Level of Service

## 2017-10-04 ENCOUNTER — Other Ambulatory Visit (INDEPENDENT_AMBULATORY_CARE_PROVIDER_SITE_OTHER): Payer: Medicare Other

## 2017-10-04 DIAGNOSIS — M810 Age-related osteoporosis without current pathological fracture: Secondary | ICD-10-CM | POA: Diagnosis not present

## 2017-10-04 MED ORDER — DENOSUMAB 60 MG/ML ~~LOC~~ SOLN
60.0000 mg | Freq: Once | SUBCUTANEOUS | Status: AC
  Administered 2017-10-04: 60 mg via SUBCUTANEOUS

## 2017-11-15 ENCOUNTER — Other Ambulatory Visit: Payer: Medicare Other

## 2018-03-09 ENCOUNTER — Telehealth: Payer: Self-pay | Admitting: Family Medicine

## 2018-03-09 NOTE — Telephone Encounter (Signed)
Left message for pt to call. It is time for prolia renewal and need to check on insurance and make sure no changes in 2019.

## 2018-04-06 ENCOUNTER — Other Ambulatory Visit (INDEPENDENT_AMBULATORY_CARE_PROVIDER_SITE_OTHER): Payer: Medicare Other

## 2018-04-06 DIAGNOSIS — M810 Age-related osteoporosis without current pathological fracture: Secondary | ICD-10-CM

## 2018-04-06 MED ORDER — DENOSUMAB 60 MG/ML ~~LOC~~ SOSY
60.0000 mg | PREFILLED_SYRINGE | Freq: Once | SUBCUTANEOUS | Status: AC
  Administered 2018-04-06: 60 mg via SUBCUTANEOUS

## 2018-06-07 ENCOUNTER — Telehealth: Payer: Self-pay

## 2018-06-07 NOTE — Telephone Encounter (Signed)
PT CALL TO MAKE AN AWV APPT. NO ANSWER LVM  KH 06-07-18

## 2018-09-03 ENCOUNTER — Telehealth: Payer: Self-pay | Admitting: Family Medicine

## 2018-09-03 NOTE — Telephone Encounter (Signed)
Left message for pt to call. Working on next prolia injection and supplemental ins we have on file is being rejested. Please verify info in system is correct.

## 2018-09-06 DIAGNOSIS — D485 Neoplasm of uncertain behavior of skin: Secondary | ICD-10-CM | POA: Diagnosis not present

## 2018-09-06 DIAGNOSIS — L570 Actinic keratosis: Secondary | ICD-10-CM | POA: Diagnosis not present

## 2018-09-06 DIAGNOSIS — L814 Other melanin hyperpigmentation: Secondary | ICD-10-CM | POA: Diagnosis not present

## 2018-09-06 DIAGNOSIS — L821 Other seborrheic keratosis: Secondary | ICD-10-CM | POA: Diagnosis not present

## 2018-09-06 DIAGNOSIS — Z85828 Personal history of other malignant neoplasm of skin: Secondary | ICD-10-CM | POA: Diagnosis not present

## 2018-09-06 DIAGNOSIS — L720 Epidermal cyst: Secondary | ICD-10-CM | POA: Diagnosis not present

## 2018-09-26 ENCOUNTER — Telehealth: Payer: Self-pay | Admitting: Family Medicine

## 2018-09-26 NOTE — Telephone Encounter (Signed)
Left message for pt to call. Needs appt scheduled for next prolia shot. Estimated Out of pocket cost witll be $0.  Needs to be after 10/07/2018. Please advise Juliann Pulse if pt calls.

## 2018-10-08 ENCOUNTER — Other Ambulatory Visit (INDEPENDENT_AMBULATORY_CARE_PROVIDER_SITE_OTHER): Payer: Medicare Other

## 2018-10-08 DIAGNOSIS — M810 Age-related osteoporosis without current pathological fracture: Secondary | ICD-10-CM | POA: Diagnosis not present

## 2018-10-08 MED ORDER — DENOSUMAB 60 MG/ML ~~LOC~~ SOSY
60.0000 mg | PREFILLED_SYRINGE | Freq: Once | SUBCUTANEOUS | Status: AC
  Administered 2018-10-08: 60 mg via SUBCUTANEOUS

## 2018-10-17 DIAGNOSIS — B078 Other viral warts: Secondary | ICD-10-CM | POA: Diagnosis not present

## 2018-10-17 DIAGNOSIS — Z85828 Personal history of other malignant neoplasm of skin: Secondary | ICD-10-CM | POA: Diagnosis not present

## 2018-10-17 DIAGNOSIS — L570 Actinic keratosis: Secondary | ICD-10-CM | POA: Diagnosis not present

## 2018-12-18 ENCOUNTER — Telehealth: Payer: Self-pay | Admitting: Internal Medicine

## 2018-12-18 NOTE — Telephone Encounter (Signed)
Tried to call pt but voicemail is full. PATIENT IS DUE FOR AWV

## 2019-02-25 ENCOUNTER — Telehealth: Payer: Self-pay | Admitting: Internal Medicine

## 2019-02-25 NOTE — Telephone Encounter (Signed)
Tried to call pt but VM not set up. She is due for AWV and we can do the AWV over the phone.

## 2019-04-10 ENCOUNTER — Other Ambulatory Visit: Payer: Self-pay

## 2019-04-10 ENCOUNTER — Other Ambulatory Visit (INDEPENDENT_AMBULATORY_CARE_PROVIDER_SITE_OTHER): Payer: Medicare Other

## 2019-04-10 DIAGNOSIS — M810 Age-related osteoporosis without current pathological fracture: Secondary | ICD-10-CM | POA: Diagnosis not present

## 2019-04-10 MED ORDER — DENOSUMAB 60 MG/ML ~~LOC~~ SOSY
60.0000 mg | PREFILLED_SYRINGE | Freq: Once | SUBCUTANEOUS | Status: AC
  Administered 2019-04-10: 14:00:00 60 mg via SUBCUTANEOUS

## 2019-06-28 ENCOUNTER — Other Ambulatory Visit: Payer: Self-pay

## 2019-07-16 ENCOUNTER — Telehealth: Payer: Self-pay | Admitting: Internal Medicine

## 2019-07-16 NOTE — Telephone Encounter (Signed)
Tried to call pt but phone just rang and rang. Pt is due for CPE

## 2019-09-27 ENCOUNTER — Encounter: Payer: Self-pay | Admitting: Family Medicine

## 2019-10-02 ENCOUNTER — Telehealth: Payer: Self-pay | Admitting: Family Medicine

## 2019-10-02 NOTE — Telephone Encounter (Signed)
Thank you. She needs a visit and labs before proceeding with Prolia.

## 2019-10-02 NOTE — Telephone Encounter (Signed)
Left message for pt to call concerning next Prolia. She also needs an appt with Vickie. Last injection she was informed she needed to schedule an appt with Vickie and to date she still has not. I also mailed a letter to pt requesting she call the office to make an appt with vickie an to receive prolia. Spoke to Vickie this AM and pt's Prolia inject is place on hold until pt calls back and schedule an appt.

## 2020-02-10 ENCOUNTER — Telehealth: Payer: Self-pay | Admitting: Internal Medicine

## 2020-02-10 NOTE — Telephone Encounter (Signed)
Tried to call pt but VM was full. Pt is Due to Annual wellness

## 2021-01-11 ENCOUNTER — Telehealth: Payer: Self-pay | Admitting: Internal Medicine

## 2021-01-11 NOTE — Telephone Encounter (Signed)
Left message for pt to call back to schedule AWV. 

## 2022-02-19 ENCOUNTER — Emergency Department
Admission: EM | Admit: 2022-02-19 | Discharge: 2022-02-19 | Disposition: A | Discharge status: Home / Self Care | Payer: Medicare Other | Attending: Emergency Medicine | Admitting: Emergency Medicine

## 2022-02-19 ENCOUNTER — Other Ambulatory Visit: Payer: Self-pay

## 2022-02-19 ENCOUNTER — Emergency Department: Payer: Medicare Other

## 2022-02-19 ENCOUNTER — Encounter: Payer: Self-pay | Admitting: Emergency Medicine

## 2022-02-19 DIAGNOSIS — S52502A Unspecified fracture of the lower end of left radius, initial encounter for closed fracture: Secondary | ICD-10-CM | POA: Insufficient documentation

## 2022-02-19 DIAGNOSIS — M25531 Pain in right wrist: Secondary | ICD-10-CM | POA: Diagnosis not present

## 2022-02-19 DIAGNOSIS — W010XXA Fall on same level from slipping, tripping and stumbling without subsequent striking against object, initial encounter: Secondary | ICD-10-CM | POA: Diagnosis not present

## 2022-02-19 DIAGNOSIS — S6992XA Unspecified injury of left wrist, hand and finger(s), initial encounter: Secondary | ICD-10-CM | POA: Diagnosis present

## 2022-02-19 MED ORDER — OXYCODONE-ACETAMINOPHEN 5-325 MG PO TABS
1.0000 | ORAL_TABLET | Freq: Once | ORAL | Status: AC
  Administered 2022-02-19: 1 via ORAL
  Filled 2022-02-19: qty 1

## 2022-02-19 MED ORDER — ONDANSETRON 4 MG PO TBDP
4.0000 mg | ORAL_TABLET | Freq: Once | ORAL | Status: AC
  Administered 2022-02-19: 4 mg via ORAL
  Filled 2022-02-19: qty 1

## 2022-02-19 MED ORDER — ONDANSETRON 4 MG PO TBDP: 4.0000 mg | ORAL_TABLET | Freq: Three times a day (TID) | ORAL | 0 refills | Status: AC | PRN

## 2022-02-19 MED ORDER — OXYCODONE-ACETAMINOPHEN 5-325 MG PO TABS: 1.0000 | ORAL_TABLET | Freq: Four times a day (QID) | ORAL | 0 refills | Status: AC | PRN

## 2022-02-19 NOTE — ED Notes (Signed)
X-ray at bedside

## 2022-02-19 NOTE — ED Triage Notes (Signed)
FIRST NURSE NOTE:  Pt injured L wrist after slipping and falling in driveway, states she was trying to catch herself, denies any other injuries. Pt holding L wrist in triage.  ?

## 2022-02-19 NOTE — ED Provider Notes (Signed)
? ?[facility] ?Provider Note ? ?Patient Contact: 10:59 PM (approximate) ? ? ?History  ? ?Wrist Injury ? ? ?HPI ? ?Kim Johnson is a 71 y.o. female presents to the emergency department with acute left wrist pain after patient fell in her driveway. Patient denies hitting her head or her neck. No similar injuries in the past. Patient is right hand dominant.  ? ?  ? ? ?Physical Exam  ? ?Triage Vital Signs: ?ED Triage Vitals  ?Enc Vitals Group  ?   BP 02/19/22 2121 (!) 121/59  ?   Pulse Rate 02/19/22 2121 73  ?   Resp --   ?   Temp 02/19/22 2121 98 ?F (36.7 ?C)  ?   Temp Source 02/19/22 2121 Oral  ?   SpO2 02/19/22 2121 100 %  ?   Weight 02/19/22 2115 135 lb (61.2 kg)  ?   Height 02/19/22 2115 '5\' 2"'$  (1.575 m)  ?   Head Circumference --   ?   Peak Flow --   ?   Pain Score 02/19/22 2115 8  ?   Pain Loc --   ?   Pain Edu? --   ?   Excl. in [place]? --   ? ? ?Most recent vital signs: ?Vitals:  ? 02/19/22 2121  ?BP: (!) 121/59  ?Pulse: 73  ?Temp: 98 ?F (36.7 ?C)  ?SpO2: 100%  ? ? ? ?General: Alert and in no acute distress. ?Eyes:  PERRL. EOMI. ?Head: No acute traumatic findings ?ENT: ?     Ears:  ?     Nose: No congestion/rhinnorhea. ?     Mouth/Throat: Mucous membranes are moist. ?Neck: No stridor. No cervical spine tenderness to palpation. ?Cardiovascular:  Good peripheral perfusion ?Respiratory: Normal respiratory effort without tachypnea or retractions. Lungs CTAB. Good air entry to the bases with no decreased or absent breath sounds. ?Gastrointestinal: Bowel sounds ?4 quadrants. Soft and nontender to palpation. No guarding or rigidity. No palpable masses. No distention. No CVA tenderness. ?Musculoskeletal: Patient has symmetric strength in the upper extremities. Patient performs limited range of motion at the left wrist. Palpable radial and ulnar pulse, left.  ?Neurologic:  No gross focal neurologic deficits are appreciated.  ?Skin:   No rash noted ?Other: ? ? ?ED Results / Procedures / Treatments   ? ?Labs ?(all labs ordered are listed, but only abnormal results are displayed) ?Labs Reviewed - No data to display ? ? ? ? ?RADIOLOGY ? ?I personally viewed and evaluated these images as part of my medical decision making, as well as reviewing the written report by the radiologist. ? ?ED Provider Interpretation: I personally reviewed x-rays of patient's left wrist and agree with radiologist interpretation.  Patient has a comminuted intra-articular distal radius fracture and ulnar styloid fracture. ?  ? ?PROCEDURES: ? ?Critical Care performed: No ? ?Procedures ? ? ?MEDICATIONS ORDERED IN ED: ?Medications  ?oxyCODONE-acetaminophen (PERCOCET/ROXICET) 5-325 MG per tablet 1 tablet (1 tablet Oral Given 02/19/22 2152)  ?ondansetron (ZOFRAN-ODT) disintegrating tablet 4 mg (4 mg Oral Given 02/19/22 2153)  ?oxyCODONE-acetaminophen (PERCOCET/ROXICET) 5-325 MG per tablet 1 tablet (1 tablet Oral Given 02/19/22 2254)  ?ondansetron (ZOFRAN-ODT) disintegrating tablet 4 mg (4 mg Oral Given 02/19/22 2254)  ? ? ? ?IMPRESSION / MDM / ASSESSMENT AND PLAN / ED COURSE  ?I reviewed the triage vital signs and the nursing notes. ?             ?               ? ?  Assessment and plan ?Distal radius fracture ?71 year old female presents to the emergency department after a mechanical fall resulting in left wrist pain.  On x-ray, patient has a comminuted distal intra-articular radius fracture and ulnar styloid fracture.  Patient was given Percocet in the emergency department for pain and a sugar-tong splint was placed.  Reach out to orthopedist on-call, Dr. Sabra Heck who recommended outpatient follow-up.  Patient was discharged with Percocet for pain as well as Zofran.  Return precautions were given to return with new or worsening symptoms.  All patient questions were answered. ? ?  ? ? ?FINAL CLINICAL IMPRESSION(S) / ED DIAGNOSES  ? ?Final diagnoses:  ?Closed fracture of distal end of left radius, unspecified fracture morphology, initial encounter   ? ? ? ?Rx / DC Orders  ? ?ED Discharge Orders   ? ?      Ordered  ?  oxyCODONE-acetaminophen (PERCOCET/ROXICET) 5-325 MG tablet  Every 6 hours PRN       ? 02/19/22 2250  ?  ondansetron (ZOFRAN-ODT) 4 MG disintegrating tablet  Every 8 hours PRN       ? 02/19/22 2250  ? ?  ?  ? ?  ? ? ? ?Note:  This document was prepared using Dragon voice recognition software and may include unintentional dictation errors. ?  ?Lannie Fields, PA-C ?02/19/22 2308 ? ?  ?Blake Divine, MD ?02/20/22 1102 ? ?

## 2022-08-11 IMAGING — DX DG WRIST COMPLETE 3+V*L*
4 series · 4 of 4 positions shown · non-contrast
Comparison: None.

CLINICAL DATA: Fall with wrist fracture.

EXAM:
LEFT WRIST - COMPLETE 3+ VIEW

[wrist ap (1 of 2)]
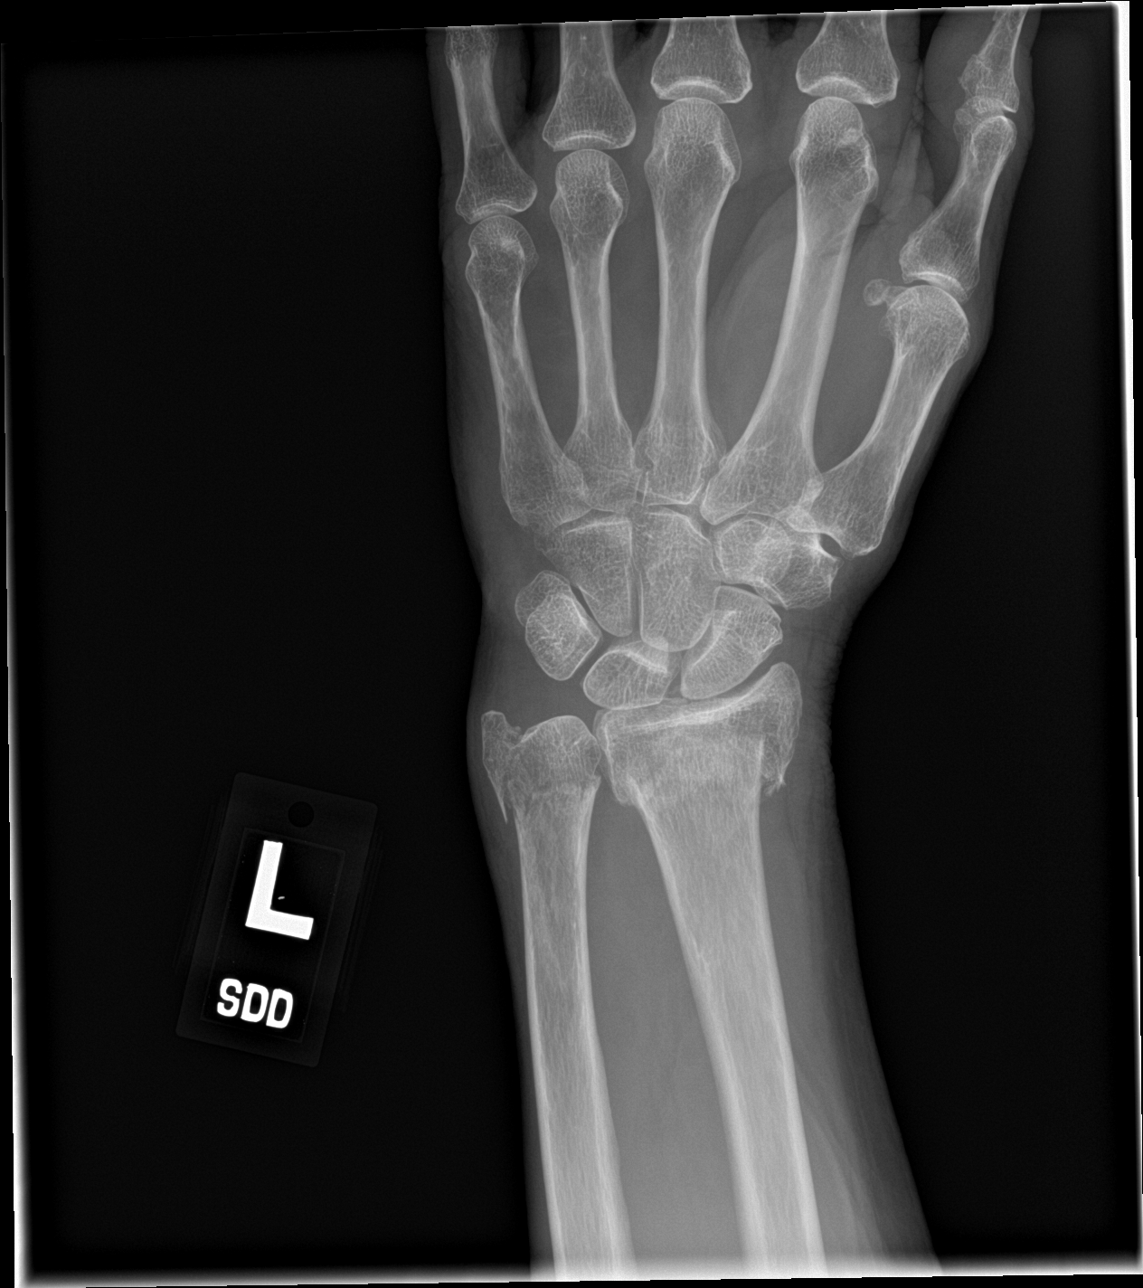

[wrist obl]
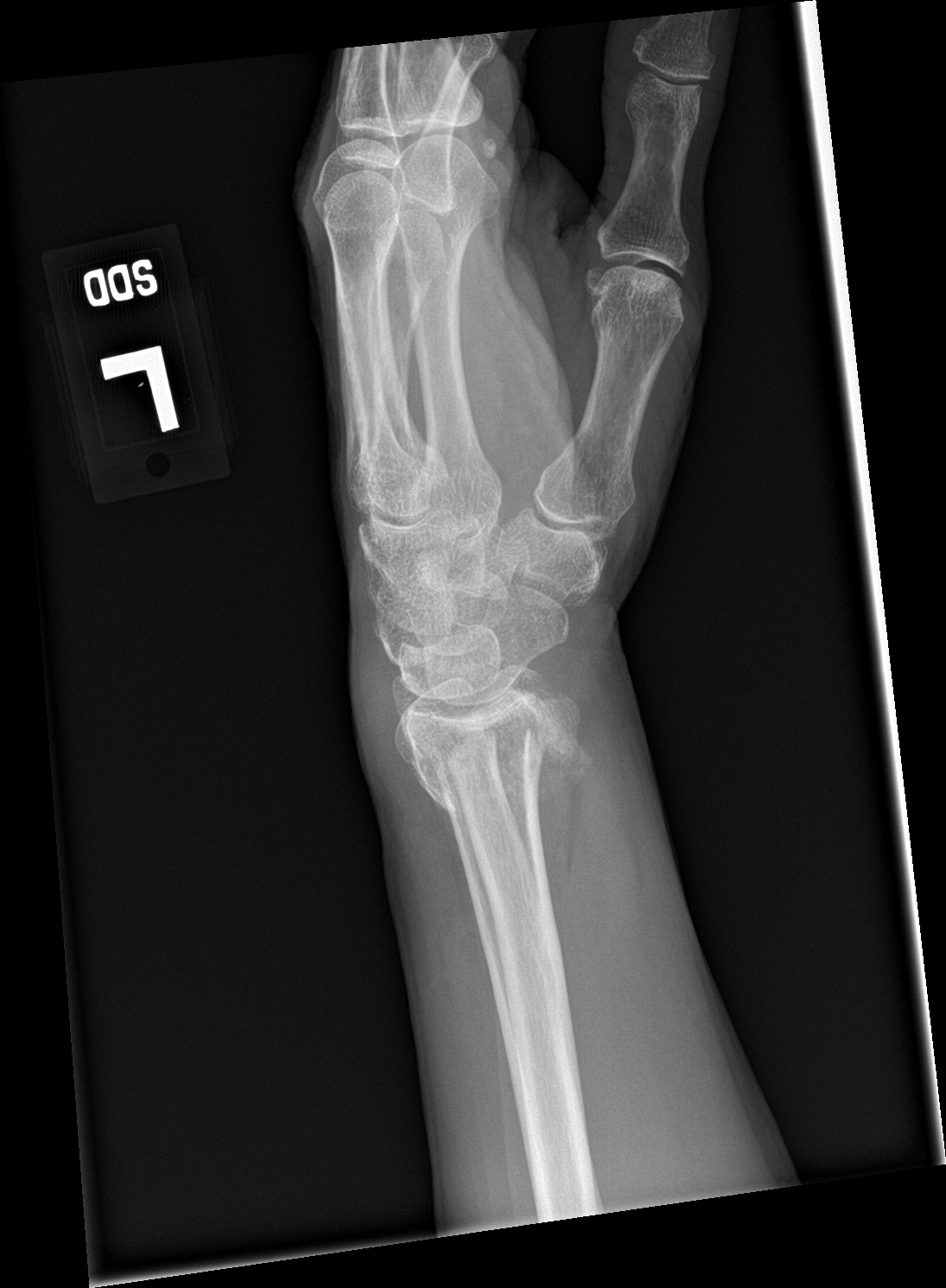

[wrist lat]
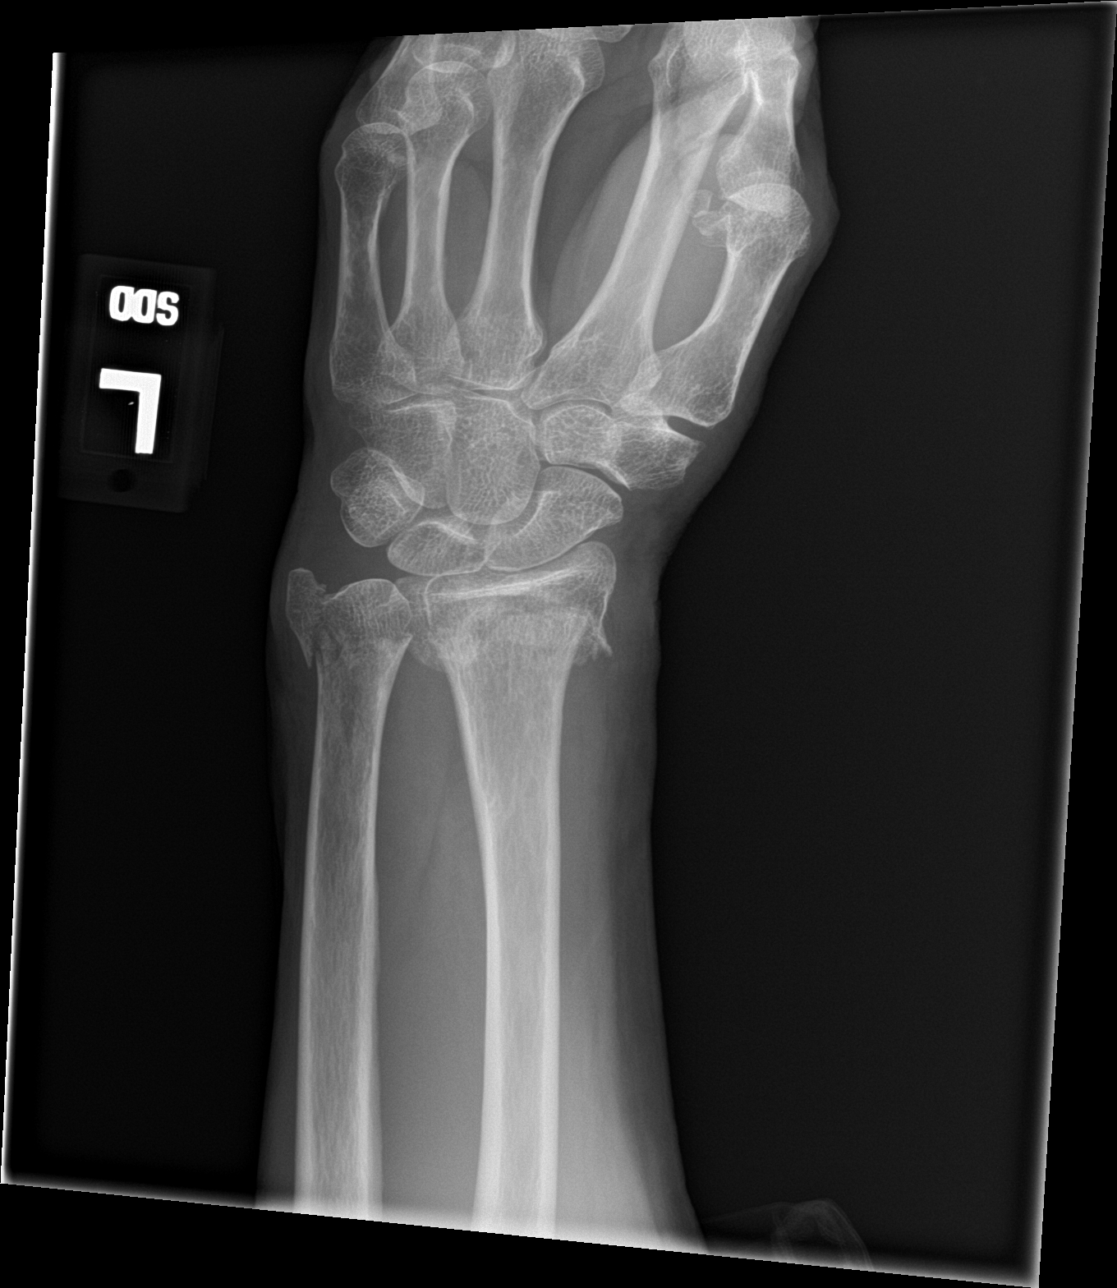

[wrist ap (2 of 2)]
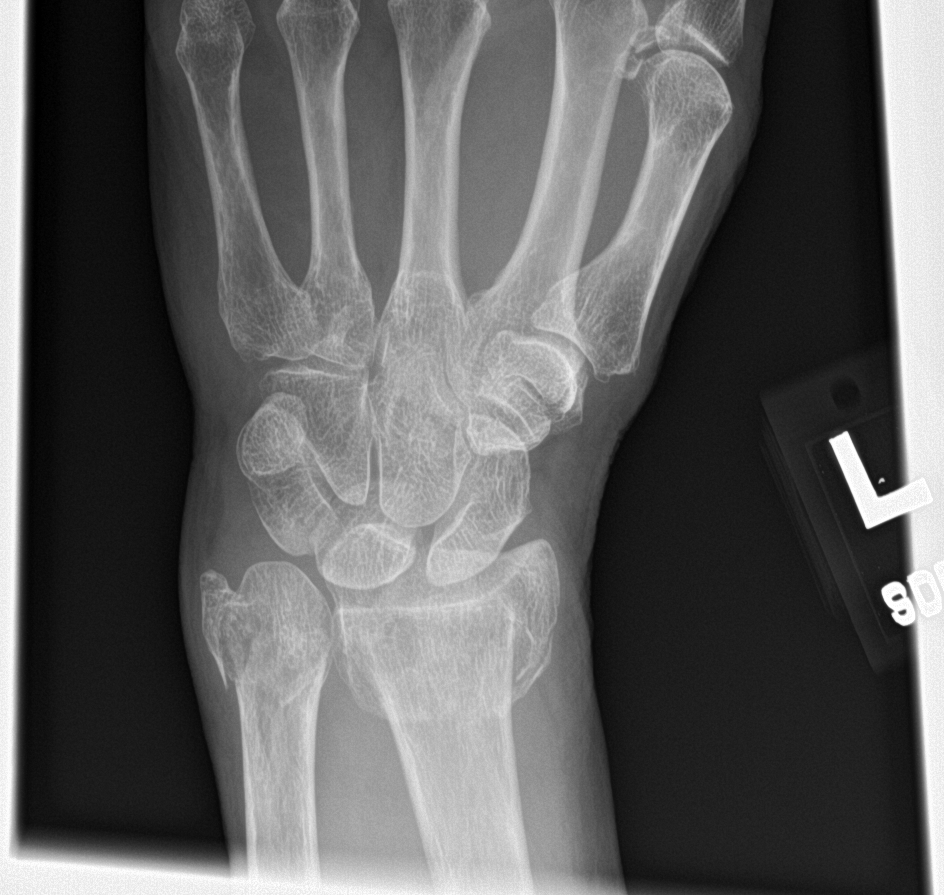

[4 of 4 positions shown; findings below may reference images not displayed]

FINDINGS: There is mild generalized soft tissue swelling.  Mild osteopenia.

There is an acute closed intra-articular impacted fracture of the
distal radius.

Comminution fragments show mild ventral spreading without dorsal or
ventral tilt.

Also noted is an acute closed comminuted intra-articular fracture of
the distal ulna, with slight medial and dorsal translation of the
fracture fragments and slight dorsal tilt.

The carpal bones are intact. The metacarpals are intact. There is
mild narrowing and spurring of the first CMC and first MCP joints.

There is no erosive arthropathy or visible foreign body. No soft
tissue gas is seen.
IMPRESSION: Osteopenia with acute distal radioulnar intra-articular fractures as
detailed above.
